# Patient Record
Sex: Male | Born: 2000 | Race: White | Hispanic: No | Marital: Single | Smoking: Never smoker
Health system: Southern US, Community
[De-identification: ages and names within clinical notes are randomized; demographics above are authoritative.]

## PROBLEM LIST (undated history)

## (undated) DIAGNOSIS — Z8614 Personal history of Methicillin resistant Staphylococcus aureus infection: Secondary | ICD-10-CM

## (undated) DIAGNOSIS — J45909 Unspecified asthma, uncomplicated: Secondary | ICD-10-CM

## (undated) DIAGNOSIS — L0591 Pilonidal cyst without abscess: Secondary | ICD-10-CM

## (undated) HISTORY — PX: ADENOIDECTOMY: SUR15

## (undated) HISTORY — PX: FRACTURE SURGERY: SHX138

## (undated) HISTORY — PX: TYMPANOSTOMY TUBE PLACEMENT: SHX32

---
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
## 2022-01-24 ENCOUNTER — Encounter (HOSPITAL_BASED_OUTPATIENT_CLINIC_OR_DEPARTMENT_OTHER): Payer: Self-pay

## 2022-01-24 ENCOUNTER — Other Ambulatory Visit: Payer: Self-pay

## 2022-01-24 DIAGNOSIS — B349 Viral infection, unspecified: Secondary | ICD-10-CM | POA: Insufficient documentation

## 2022-01-24 DIAGNOSIS — Z20822 Contact with and (suspected) exposure to covid-19: Secondary | ICD-10-CM | POA: Insufficient documentation

## 2022-01-24 DIAGNOSIS — H66001 Acute suppurative otitis media without spontaneous rupture of ear drum, right ear: Secondary | ICD-10-CM | POA: Insufficient documentation

## 2022-01-24 NOTE — ED Triage Notes (Signed)
Patient here POV from Home with Otalgia and Sore Throat. ? ?Endorses Sore Throat for approximately 5 days and Bilateral Ear Pain as well. Originally it was just the Right Ear but today it became bilateral.  ? ?No Known Fevers. No Congestion. ? ?NAD Noted during Triage. A&Ox4. GCS 15. Ambulatory. ?

## 2022-01-25 ENCOUNTER — Emergency Department (HOSPITAL_BASED_OUTPATIENT_CLINIC_OR_DEPARTMENT_OTHER)
Admission: EM | Admit: 2022-01-25 | Discharge: 2022-01-25 | Disposition: A | Discharge status: Home / Self Care | Payer: Medicaid Other | Attending: Emergency Medicine | Admitting: Emergency Medicine

## 2022-01-25 DIAGNOSIS — B349 Viral infection, unspecified: Secondary | ICD-10-CM

## 2022-01-25 DIAGNOSIS — H66001 Acute suppurative otitis media without spontaneous rupture of ear drum, right ear: Secondary | ICD-10-CM

## 2022-01-25 LAB — RESP PANEL BY RT-PCR (FLU A&B, COVID) ARPGX2
Influenza A by PCR: NEGATIVE
Influenza B by PCR: NEGATIVE
SARS Coronavirus 2 by RT PCR: NEGATIVE

## 2022-01-25 LAB — GROUP A STREP BY PCR: Group A Strep by PCR: NOT DETECTED

## 2022-01-25 MED ORDER — DEXAMETHASONE SODIUM PHOSPHATE 10 MG/ML IJ SOLN
10.0000 mg | Freq: Once | INTRAMUSCULAR | Status: AC
  Administered 2022-01-25: 10 mg via INTRAMUSCULAR
  Filled 2022-01-25: qty 1

## 2022-01-25 MED ORDER — AMOXICILLIN 875 MG PO TABS: 875.0000 mg | ORAL_TABLET | Freq: Two times a day (BID) | ORAL | 0 refills | Status: AC

## 2022-01-25 MED ORDER — LIDOCAINE VISCOUS HCL 2 % MT SOLN
15.0000 mL | Freq: Once | OROMUCOSAL | Status: AC
  Administered 2022-01-25: 15 mL via OROMUCOSAL
  Filled 2022-01-25: qty 15

## 2022-01-25 MED ORDER — ACETAMINOPHEN 325 MG PO TABS
650.0000 mg | ORAL_TABLET | Freq: Once | ORAL | Status: AC
  Administered 2022-01-25: 650 mg via ORAL
  Filled 2022-01-25: qty 2

## 2022-01-25 NOTE — ED Provider Notes (Signed)
? ?[facility] EMERGENCY DEPT  ?Provider Note ? ?CSN: [id number] ?Arrival date & time: 01/24/22 2051 ? ?History ?Chief Complaint  ?Patient presents with  ? Otalgia  ? Sore Throat  ? ? ?Darrell Morton is a 21 y.o. male with no significant PMH reports 4-5 days of sore throat, R ear pain initially, now both ears. Mild cough, no fevers. Pain is worse with swallowing.  ? ? ?Home Medications ?Prior to Admission medications   ?Medication Sig Start Date End Date Taking? Authorizing Provider  ?amoxicillin (AMOXIL) 875 MG tablet Take 1 tablet (875 mg total) by mouth 2 (two) times daily for 7 days. 01/25/22 02/01/22 Yes Pollyann Savoy, MD  ?albuterol (PROVENTIL HFA;VENTOLIN HFA) 108 (90 BASE) MCG/ACT inhaler Inhale 2 puffs into the lungs every 6 (six) hours as needed for wheezing.    [provider]  ?docusate sodium (COLACE) 100 MG capsule Take 1 capsule (100 mg total) by mouth 2 (two) times daily. 12/05/15   Janalee Dane, PA-C  ?methocarbamol (ROBAXIN) 500 MG tablet Take 1 tablet (500 mg total) by mouth 4 (four) times daily. 11/23/17   Renne Crigler, PA-C  ?naproxen (NAPROSYN) 500 MG tablet Take 1 tablet (500 mg total) by mouth 2 (two) times daily. 11/23/17   Renne Crigler, PA-C  ?oxyCODONE-acetaminophen (PERCOCET/ROXICET) 5-325 MG tablet Take 1-2 tablets by mouth every 4 (four) hours as needed for moderate pain or severe pain. 12/05/15   Janalee Dane, PA-C  ?promethazine (PHENERGAN) 12.5 MG tablet Take 1 tablet (12.5 mg total) by mouth every 6 (six) hours as needed for nausea or vomiting. 12/06/15   Janalee Dane, PA-C  ? ? ? ?Allergies    ?Patient has no known allergies. ? ? ?Review of Systems   ?Review of Systems ?Please see HPI for pertinent positives and negatives ? ?Physical Exam ?BP (!) 129/92 (BP Location: Right Arm)   Pulse 73   Temp 98.2 ?F (36.8 ?C) (Oral)   Resp 18   Ht 5\' 8"  (1.727 m)   Wt 106.6 kg   SpO2 95%   BMI 35.73 kg/m?  ? ?Physical Exam ?Vitals and nursing note  reviewed.  ?Constitutional:   ?   Appearance: Normal appearance.  ?HENT:  ?   Head: Normocephalic and atraumatic.  ?   Right Ear: A middle ear effusion is present. Tympanic membrane is erythematous.  ?   Left Ear: Tympanic membrane normal.  ?   Nose: Nose normal.  ?   Mouth/Throat:  ?   Mouth: Mucous membranes are moist.  ?   Pharynx: Uvula midline. Posterior oropharyngeal erythema present. No pharyngeal swelling or oropharyngeal exudate.  ?   Tonsils: No tonsillar abscesses. 2+ on the right. 2+ on the left.  ?Eyes:  ?   Extraocular Movements: Extraocular movements intact.  ?   Conjunctiva/sclera: Conjunctivae normal.  ?Cardiovascular:  ?   Rate and Rhythm: Normal rate.  ?Pulmonary:  ?   Effort: Pulmonary effort is normal.  ?   Breath sounds: Normal breath sounds.  ?Abdominal:  ?   General: Abdomen is flat.  ?   Palpations: Abdomen is soft.  ?   Tenderness: There is no abdominal tenderness.  ?Musculoskeletal:     ?   General: No swelling. Normal range of motion.  ?   Cervical back: Neck supple.  ?Lymphadenopathy:  ?   Cervical: No cervical adenopathy.  ?Skin: ?   General: Skin is warm and dry.  ?Neurological:  ?   General: No focal deficit present.  ?  Mental Status: He is alert.  ?Psychiatric:     ?   Mood and Affect: Mood normal.  ? ? ?ED Results / Procedures / Treatments   ?EKG ?None ? ?Procedures ?Procedures ? ?Medications Ordered in the ED ?Medications  ?dexamethasone (DECADRON) injection 10 mg (has no administration in time range)  ?lidocaine (XYLOCAINE) 2 % viscous mouth solution 15 mL (15 mLs Mouth/Throat Given 01/25/22 0241)  ?acetaminophen (TYLENOL) tablet 650 mg (650 mg Oral Given 01/25/22 0307)  ? ? ?Initial Impression and Plan ? Patient with likely viral URI including sore throat also has signs of otitis media. Will give Decadron for sore throat, plan Rx for Amoxil for ear infection. Recommend Motrin/APAP and salt water gargles for pain. PCP follow up. Strep, Covid/Flu are negative.  ? ?ED Course  ? ?   ? ? ?MDM Rules/Calculators/A&P ?Medical Decision Making ?Problems Addressed: ?Non-recurrent acute suppurative otitis media of right ear without spontaneous rupture of tympanic membrane: acute illness or injury ?Pharyngitis with viral syndrome: acute illness or injury ? ?Amount and/or Complexity of Data Reviewed ?Labs: ordered. Decision-making details documented in ED Course. ? ?Risk ?OTC drugs. ?Prescription drug management. ? ? ? ?Final Clinical Impression(s) / ED Diagnoses ?Final diagnoses:  ?Non-recurrent acute suppurative otitis media of right ear without spontaneous rupture of tympanic membrane  ?Pharyngitis with viral syndrome  ? ? ?Rx / DC Orders ?ED Discharge Orders   ? ?      Ordered  ?  amoxicillin (AMOXIL) 875 MG tablet  2 times daily       ? 01/25/22 0317  ? ?  ?  ? ?  ? ?  ?Pollyann Savoy, MD ?01/25/22 [phone] ? ?

## 2022-11-02 ENCOUNTER — Emergency Department (HOSPITAL_COMMUNITY)
Admission: EM | Admit: 2022-11-02 | Discharge: 2022-11-02 | Disposition: A | Discharge status: Home / Self Care | Payer: Medicaid Other | Attending: Emergency Medicine | Admitting: Emergency Medicine

## 2022-11-02 DIAGNOSIS — Z1152 Encounter for screening for COVID-19: Secondary | ICD-10-CM | POA: Insufficient documentation

## 2022-11-02 DIAGNOSIS — J45909 Unspecified asthma, uncomplicated: Secondary | ICD-10-CM | POA: Diagnosis not present

## 2022-11-02 DIAGNOSIS — J029 Acute pharyngitis, unspecified: Secondary | ICD-10-CM | POA: Diagnosis present

## 2022-11-02 DIAGNOSIS — J020 Streptococcal pharyngitis: Secondary | ICD-10-CM

## 2022-11-02 LAB — RESP PANEL BY RT-PCR (RSV, FLU A&B, COVID)  RVPGX2
Influenza A by PCR: NEGATIVE
Influenza B by PCR: NEGATIVE
Resp Syncytial Virus by PCR: NEGATIVE
SARS Coronavirus 2 by RT PCR: NEGATIVE

## 2022-11-02 LAB — GROUP A STREP BY PCR: Group A Strep by PCR: DETECTED — AB

## 2022-11-02 MED ORDER — ACETAMINOPHEN 325 MG PO TABS
650.0000 mg | ORAL_TABLET | Freq: Once | ORAL | Status: AC
  Administered 2022-11-02: 650 mg via ORAL
  Filled 2022-11-02: qty 2

## 2022-11-02 MED ORDER — PENICILLIN G BENZATHINE 1200000 UNIT/2ML IM SUSY
1.2000 10*6.[IU] | PREFILLED_SYRINGE | Freq: Once | INTRAMUSCULAR | Status: AC
  Administered 2022-11-02: 1.2 10*6.[IU] via INTRAMUSCULAR
  Filled 2022-11-02: qty 2

## 2022-11-02 MED ORDER — DEXAMETHASONE SODIUM PHOSPHATE 10 MG/ML IJ SOLN
10.0000 mg | Freq: Once | INTRAMUSCULAR | Status: AC
  Administered 2022-11-02: 10 mg via INTRAMUSCULAR
  Filled 2022-11-02: qty 1

## 2022-11-02 MED ORDER — KETOROLAC TROMETHAMINE 15 MG/ML IJ SOLN
15.0000 mg | Freq: Once | INTRAMUSCULAR | Status: AC
  Administered 2022-11-02: 15 mg via INTRAMUSCULAR
  Filled 2022-11-02: qty 1

## 2022-11-02 NOTE — ED Provider Notes (Signed)
[facility] Provider Note   CSN: [id number] Arrival date & time: 11/02/22  1214     History  Chief Complaint  Patient presents with   Sore Throat    Darrell Morton is a 22 y.o. male with a past medical history significant for asthma who presents to the ED due to sore throat x 3 days.  Also endorses some dizziness for the past 2 days. No speech changes, visual changes, or unilateral weakness. No nausea, vomiting, or diarrhea.  Denies fever.  Denies shortness of breath.  Admits to pain when swallowing however, denies difficulty swallowing.  No chest pain. Denies wheezing. Denies neck stiffness.  History obtained from patient and past medical records. No interpreter used during encounter.       Home Medications Prior to Admission medications   Medication Sig Start Date End Date Taking? Authorizing Provider  albuterol (PROVENTIL HFA;VENTOLIN HFA) 108 (90 BASE) MCG/ACT inhaler Inhale 2 puffs into the lungs every 6 (six) hours as needed for wheezing.    [provider]  docusate sodium (COLACE) 100 MG capsule Take 1 capsule (100 mg total) by mouth 2 (two) times daily. 12/05/15   Lovett Calender, PA-C  methocarbamol (ROBAXIN) 500 MG tablet Take 1 tablet (500 mg total) by mouth 4 (four) times daily. 11/23/17   Carlisle Cater, PA-C  naproxen (NAPROSYN) 500 MG tablet Take 1 tablet (500 mg total) by mouth 2 (two) times daily. 11/23/17   Carlisle Cater, PA-C  oxyCODONE-acetaminophen (PERCOCET/ROXICET) 5-325 MG tablet Take 1-2 tablets by mouth every 4 (four) hours as needed for moderate pain or severe pain. 12/05/15   Lovett Calender, PA-C  promethazine (PHENERGAN) 12.5 MG tablet Take 1 tablet (12.5 mg total) by mouth every 6 (six) hours as needed for nausea or vomiting. 12/06/15   Lovett Calender, PA-C      Allergies    Patient has no known allergies.    Review of Systems   Review of Systems  Constitutional:  Negative for chills and fever.   HENT:  Positive for sore throat. Negative for trouble swallowing and voice change.   Respiratory:  Negative for cough and shortness of breath.   Cardiovascular:  Negative for chest pain.  All other systems reviewed and are negative.   Physical Exam Updated Vital Signs BP 133/79 (BP Location: Left Arm)   Pulse 96   Temp 100.1 F (37.8 C) (Oral)   Resp 18   SpO2 97%  Physical Exam Vitals and nursing note reviewed.  Constitutional:      General: He is not in acute distress.    Appearance: He is not ill-appearing.  HENT:     Head: Normocephalic.     Mouth/Throat:     Comments: Posterior oropharynx clear and mucous membranes moist, there is mild erythema but no edema or tonsillar exudates, uvula midline, normal phonation, no trismus, tolerating secretions without difficulty. Eyes:     Pupils: Pupils are equal, round, and reactive to light.  Cardiovascular:     Rate and Rhythm: Normal rate and regular rhythm.     Pulses: Normal pulses.     Heart sounds: Normal heart sounds. No murmur heard.    No friction rub. No gallop.  Pulmonary:     Effort: Pulmonary effort is normal.     Breath sounds: Normal breath sounds.  Abdominal:     General: Abdomen is flat. There is no distension.     Palpations: Abdomen is soft.  Tenderness: There is no abdominal tenderness. There is no guarding or rebound.  Musculoskeletal:        General: Normal range of motion.     Cervical back: Neck supple.  Skin:    General: Skin is warm and dry.  Neurological:     General: No focal deficit present.     Mental Status: He is alert.  Psychiatric:        Mood and Affect: Mood normal.        Behavior: Behavior normal.     ED Results / Procedures / Treatments   Labs (all labs ordered are listed, but only abnormal results are displayed) Labs Reviewed  GROUP A STREP BY PCR - Abnormal; Notable for the following components:      Result Value   Group A Strep by PCR DETECTED (*)    All other  components within normal limits  RESP PANEL BY RT-PCR (RSV, FLU A&B, COVID)  [id number]    EKG None  Radiology No results found.  Procedures Procedures    Medications Ordered in ED Medications  dexamethasone (DECADRON) injection 10 mg (10 mg Intramuscular Given 11/02/22 1535)  ketorolac (TORADOL) 15 MG/ML injection 15 mg (15 mg Intramuscular Given 11/02/22 1535)  penicillin g benzathine (BICILLIN LA) 1200000 UNIT/2ML injection 1.2 Million Units (1.2 Million Units Intramuscular Given 11/02/22 1533)  acetaminophen (TYLENOL) tablet 650 mg (650 mg Oral Given 11/02/22 1600)    ED Course/ Medical Decision Making/ A&P Clinical Course as of 11/02/22 1620  Wed Nov 02, 2022  1542 Group A Strep by PCR(!): DETECTED [CA]    Clinical Course User Index [CA] Mannie Stabile, PA-C                             Medical Decision Making Amount and/or Complexity of Data Reviewed Labs: ordered. Decision-making details documented in ED Course.  Risk OTC drugs. Prescription drug management.   This patient presents to the ED for concern of sore throat, this involves an extensive number of treatment options, and is a complaint that carries with it a high risk of complications and morbidity.  The differential diagnosis includes strep throat, viral process, abscess, meningitis, etc  22 year old male presents to the ED due to sore throat x 3 days.  Also endorses some dizziness. History of asthma.  No nausea, vomiting, or diarrhea.  Denies fever.  Denies difficulty swallowing.  Upon arrival, patient tachycardic and borderline febrile.  Normal oxygen saturation.  Patient denies chest pain and shortness of breath.  Low suspicion for PE.  Patient in no acute distress.  Physical exam significant for erythema to throat with 2+ tonsillar hypertrophy.  No exudates.  No abscess.  Uvula midline.  Patient tolerating oral secretions without difficulty.  Normal neurological exam without any deficits. Low suspicion for  CVA. Strep positive. Patient given Decadron, Toradol, and Bicillin.  Discussed over-the-counter ibuprofen or Tylenol as needed for fever.  No meningismus to suggest meningitis.  Lungs clear to auscultation bilaterally.  No wheeze.  Low suspicion for asthma exacerbation.   HR elevated initially, improved after Tylenol. Suspect tachycardia related to fever from strep throat.  Patient stable for discharge. Strict ED precautions discussed with patient. Patient states understanding and agrees to plan. Patient discharged home in no acute distress and stable vitals        Final Clinical Impression(s) / ED Diagnoses Final diagnoses:  Strep throat    Rx / DC Orders ED  Discharge Orders     None         Karie Kirks 11/02/22 1621    Blanchie Dessert, MD 11/02/22 1751

## 2022-11-02 NOTE — ED Provider Triage Note (Signed)
Emergency Medicine Provider Triage Evaluation Note  Darrell Morton , a 22 y.o. male  was evaluated in triage.  Pt complains of sore throat in the last 3 days.  Reported his sore throat has gotten worse.  No known sick contacts.  Has not taken any medication.  No nausea or vomiting.  No chest pain or shortness of breath..  Review of Systems  Positive: As above Negative: As above  Physical Exam  BP 131/80   Pulse (!) 124   Temp 100.1 F (37.8 C) (Oral)   Resp 18   SpO2 94%  Gen:   Awake, no distress   Resp:  Normal effort  MSK:   Moves extremities without difficulty  Other:  Posterior oropharyngeal erythema.  Medical Decision Making  Medically screening exam initiated at 12:50 PM.  Appropriate orders placed.  Darrell Morton was informed that the remainder of the evaluation will be completed by another provider, this initial triage assessment does not replace that evaluation, and the importance of remaining in the ED until their evaluation is complete.    Rex Kras, [place] 11/02/22 1251

## 2022-11-02 NOTE — Discharge Instructions (Addendum)
It was a pleasure taking care of you today. As discussed you tested positive for strep. You were treated in the ER. Follow-up with PCP if symptoms do not improve. Return to the ER for new or worsening symptoms.

## 2022-11-02 NOTE — ED Triage Notes (Signed)
Pt c/o sore throat since Sunday and endorses excessive saliva.

## 2023-10-19 ENCOUNTER — Other Ambulatory Visit: Payer: Self-pay

## 2023-10-19 ENCOUNTER — Emergency Department (HOSPITAL_COMMUNITY)
Admission: EM | Admit: 2023-10-19 | Discharge: 2023-10-19 | Disposition: A | Discharge status: Home / Self Care | Payer: Medicaid Other | Attending: Pediatric Emergency Medicine | Admitting: Pediatric Emergency Medicine

## 2023-10-19 DIAGNOSIS — H9201 Otalgia, right ear: Secondary | ICD-10-CM | POA: Diagnosis present

## 2023-10-19 DIAGNOSIS — H6691 Otitis media, unspecified, right ear: Secondary | ICD-10-CM | POA: Insufficient documentation

## 2023-10-19 DIAGNOSIS — H6690 Otitis media, unspecified, unspecified ear: Secondary | ICD-10-CM

## 2023-10-19 MED ORDER — CETIRIZINE HCL 10 MG PO TABS: 10.0000 mg | ORAL_TABLET | Freq: Every day | ORAL | 0 refills | Status: AC

## 2023-10-19 MED ORDER — AMOXICILLIN 500 MG PO CAPS: 500.0000 mg | ORAL_CAPSULE | Freq: Two times a day (BID) | ORAL | 0 refills | Status: AC

## 2023-10-19 MED ORDER — CIPROFLOXACIN-DEXAMETHASONE 0.3-0.1 % OT SUSP: 4.0000 [drp] | Freq: Two times a day (BID) | OTIC | 0 refills | Status: AC

## 2023-10-19 NOTE — ED Triage Notes (Signed)
 Patient reports right ear ache with yellowish discharge for several days /decreased hearing . He adds mild sore throat . No fever or chills.

## 2023-10-19 NOTE — ED Provider Notes (Signed)
 [facility] Provider Note   CSN: [id number] Arrival date & time: 10/19/23  0148     History  Chief Complaint  Patient presents with   Right Otitis Media    Darrell Morton is a 23 y.o. male healthy with 5 days of progressive right ear pain.  Noted fullness to the right ear and pain worsened while swimming in a hotel pool.  Noted drainage from the ear 2 days ago and pain has persisted and so presents.  Also noted congestion and left ear fullness but no pain.  HPI     Home Medications Prior to Admission medications   Medication Sig Start Date End Date Taking? Authorizing Provider  amoxicillin  (AMOXIL ) 500 MG capsule Take 1 capsule (500 mg total) by mouth 2 (two) times daily for 7 days. 10/19/23 10/26/23 Yes Atthew Coutant, Bernardino PARAS, MD  cetirizine  (ZYRTEC  ALLERGY) 10 MG tablet Take 1 tablet (10 mg total) by mouth daily for 14 days. 10/19/23 11/02/23 Yes Glynn Freas, Bernardino PARAS, MD  albuterol (PROVENTIL HFA;VENTOLIN HFA) 108 (90 BASE) MCG/ACT inhaler Inhale 2 puffs into the lungs every 6 (six) hours as needed for wheezing.    [provider]  docusate sodium  (COLACE) 100 MG capsule Take 1 capsule (100 mg total) by mouth 2 (two) times daily. 12/05/15   Burnard Regan, PA-C  methocarbamol  (ROBAXIN ) 500 MG tablet Take 1 tablet (500 mg total) by mouth 4 (four) times daily. 11/23/17   Geiple, Joshua, PA-C  naproxen  (NAPROSYN ) 500 MG tablet Take 1 tablet (500 mg total) by mouth 2 (two) times daily. 11/23/17   Geiple, Joshua, PA-C  oxyCODONE -acetaminophen  (PERCOCET/ROXICET) 5-325 MG tablet Take 1-2 tablets by mouth every 4 (four) hours as needed for moderate pain or severe pain. 12/05/15   Burnard Regan, PA-C  promethazine  (PHENERGAN ) 12.5 MG tablet Take 1 tablet (12.5 mg total) by mouth every 6 (six) hours as needed for nausea or vomiting. 12/06/15   Burnard Regan, PA-C      Allergies    Patient has no known allergies.    Review of Systems   Review of  Systems  All other systems reviewed and are negative.   Physical Exam Updated Vital Signs BP 128/88 (BP Location: Left Arm)   Pulse (!) 102   Temp 98.6 F (37 C) (Oral)   Resp 16   SpO2 95%  Physical Exam Vitals and nursing note reviewed.  Constitutional:      Appearance: He is well-developed.  HENT:     Head: Normocephalic and atraumatic.     Ears:     Comments: Erythematous retracted left TM and partially visualized right tympanic membrane with purulent debris dried in the canal and pain with pinna traction Eyes:     Conjunctiva/sclera: Conjunctivae normal.  Cardiovascular:     Rate and Rhythm: Normal rate and regular rhythm.     Heart sounds: No murmur heard. Pulmonary:     Effort: Pulmonary effort is normal. No respiratory distress.     Breath sounds: Normal breath sounds.  Abdominal:     Palpations: Abdomen is soft.     Tenderness: There is no abdominal tenderness.  Musculoskeletal:     Cervical back: Neck supple.  Skin:    General: Skin is warm and dry.     Capillary Refill: Capillary refill takes less than 2 seconds.  Neurological:     General: No focal deficit present.     Mental Status: He is alert and oriented to person,  place, and time.     ED Results / Procedures / Treatments   Labs (all labs ordered are listed, but only abnormal results are displayed) Labs Reviewed - No data to display  EKG None  Radiology No results found.  Procedures Procedures    Medications Ordered in ED Medications - No data to display  ED Course/ Medical Decision Making/ A&P                                 Medical Decision Making Risk OTC drugs. Prescription drug management.   MDM:  23 y.o. presents with 5 days of symptoms as per above.  The patient's presentation is most consistent with Acute Otitis Media.  Question possible perforation versus otitis externa with pain with pinna traction and pool exposure.  Will provide dual therapy with amoxicillin  and  Ciprodex .  Also instructed on antihistamine therapy.    The patient is well-appearing and well-hydrated.  The patient's lungs are clear to auscultation bilaterally. Additionally, the patient has a soft/non-tender abdomen and no oropharyngeal exudates.  There are no signs of meningismus.  I see no signs of a Serious Bacterial Infection.  I have a low suspicion for Pneumonia as the patient has not had any cough and is neither tachypneic nor hypoxic on room air.  Additionally, the patient is CTAB.  I believe that the patient is safe for outpatient followup.  The patient was discharged with a prescription for amoxicillin  Ciprodex  and Zyrtec .  I discussed return precautions with the patient and patient was discharged.        Final Clinical Impression(s) / ED Diagnoses Final diagnoses:  Ear infection    Rx / DC Orders ED Discharge Orders          Ordered    amoxicillin  (AMOXIL ) 500 MG capsule  2 times daily        10/19/23 0527    cetirizine  (ZYRTEC  ALLERGY) 10 MG tablet  Daily        10/19/23 0527              Donzetta Bernardino PARAS, MD 10/19/23 [phone]
